# Patient Record
Sex: Female | Born: 1990 | Race: White | Hispanic: No | Marital: Married | Smoking: Never smoker
Health system: Southern US, Community
[De-identification: ages and names within clinical notes are randomized; demographics above are authoritative.]

---
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
## 2019-01-26 ENCOUNTER — Other Ambulatory Visit: Payer: Self-pay | Admitting: Obstetrics and Gynecology

## 2019-02-16 ENCOUNTER — Other Ambulatory Visit: Payer: Self-pay | Admitting: Obstetrics and Gynecology

## 2019-02-16 ENCOUNTER — Telehealth: Payer: Self-pay | Admitting: *Deleted

## 2019-02-16 NOTE — Telephone Encounter (Signed)
Called to inform pt of no visitors or children being allowed to come to appt. Pt states she will have to cancel and will call back to reschedule.

## 2019-05-30 ENCOUNTER — Other Ambulatory Visit: Payer: Self-pay | Admitting: Obstetrics and Gynecology

## 2019-06-16 ENCOUNTER — Telehealth: Payer: Self-pay | Admitting: Obstetrics and Gynecology

## 2019-06-16 NOTE — Telephone Encounter (Signed)

## 2019-06-17 ENCOUNTER — Ambulatory Visit (INDEPENDENT_AMBULATORY_CARE_PROVIDER_SITE_OTHER): Admitting: Obstetrics and Gynecology

## 2019-06-17 ENCOUNTER — Other Ambulatory Visit (HOSPITAL_COMMUNITY)
Admission: RE | Admit: 2019-06-17 | Discharge: 2019-06-17 | Disposition: A | Discharge status: Home / Self Care | Source: Ambulatory Visit | Attending: Obstetrics and Gynecology | Admitting: Obstetrics and Gynecology

## 2019-06-17 ENCOUNTER — Other Ambulatory Visit: Payer: Self-pay

## 2019-06-17 ENCOUNTER — Encounter: Payer: Self-pay | Admitting: Obstetrics and Gynecology

## 2019-06-17 VITALS — BP 99/59 | HR 75 | Ht 67.0 in | Wt 171.0 lb

## 2019-06-17 DIAGNOSIS — Z01419 Encounter for gynecological examination (general) (routine) without abnormal findings: Secondary | ICD-10-CM

## 2019-06-17 NOTE — Progress Notes (Signed)
Patient ID: Rebekah Livingston, female   DOB: [birth year], 28 y.o.   MRN: [id number]   Assessment:  Annual Gyn Exam Fhx of breast cancer Plan:  1. pap smear done, next pap due 3 years 2. return annually or prn 3    Annual mammogram advised after age 25 Subjective:  Rebekah Livingston is a 28 y.o. female No obstetric history on file. who presents for annual exam. Patient's last menstrual period was 05/28/2019. The patient has complaints today of No abnormal PAP. Has some spotting in between periods and acne flares up. Does self breast exams, notices nothing abnormal. Paternal grandmother had breast cancer at age 32. Husband has vasectomy, husband is in the [employer].   The following portions of the patient's history were reviewed and updated as appropriate: allergies, current medications, past family history, past medical history, past social history, past surgical history and problem list. No past medical history on file.    No current outpatient medications on file.  Review of Systems Constitutional: negative Gastrointestinal: negative Genitourinary: normal  Objective:  BP (!) 99/59 (BP Location: Right Arm, Patient Position: Sitting, Cuff Size: Normal)   Pulse 75   Ht 5\' 7"  (1.702 m)   Wt 171 lb (77.6 kg)   LMP 05/28/2019   BMI 26.78 kg/m    BMI: Body mass index is 26.78 kg/m.  General Appearance: Alert, appropriate appearance for age. No acute distress HEENT: Grossly normal Neck / Thyroid:  Cardiovascular: RRR; normal S1, S2, no murmur Lungs: CTA bilaterally Back: No CVAT Breast Exam:Not examined Gastrointestinal: Soft, non-tender, no masses or organomegaly Pelvic Exam: VAGINA: normal appearing vagina CERVIX: normal appearing cervix with normal mucousa,  UTERUS: uterus is normal, retroverted  PAP: Pap smear done today. Lymphatic Exam: Non-palpable nodes in neck, clavicular, axillary, or inguinal regions  Skin: no rash or abnormalities Neurologic: Normal gait and speech, no tremor   Psychiatric: Alert and oriented, appropriate affect.  Urinalysis:Not done  By signing my name below, I, Samul Dada, attest that this documentation has been prepared under the direction and in the presence of Jonnie Kind, MD. Electronically Signed: [place]. 06/17/19. 10:45 AM.  I personally performed the services described in this documentation, which was SCRIBED in my presence. The recorded information has been reviewed and considered accurate. It has been edited as necessary during review. Jonnie Kind, MD

## 2019-06-21 LAB — CYTOLOGY - PAP
Diagnosis: NEGATIVE
HPV: NOT DETECTED

## 2020-05-02 ENCOUNTER — Telehealth: Payer: Self-pay | Admitting: Obstetrics and Gynecology

## 2020-05-02 NOTE — Telephone Encounter (Signed)
Patient called stating that she would like to discuss something with the nurse, Pt did not state the reason. Please contact pt

## 2020-05-02 NOTE — Telephone Encounter (Signed)
Pt reports that she is having irregular periods and just hasn't felt well. She knows that she isn't pregnant. Wanted to see Dr. Emelda Fear. Made her an appointment for next Friday.

## 2020-05-11 ENCOUNTER — Ambulatory Visit: Admitting: Obstetrics and Gynecology

## 2020-05-15 ENCOUNTER — Telehealth: Payer: Self-pay | Admitting: Women's Health

## 2020-05-15 NOTE — Telephone Encounter (Signed)
Called patient regarding appointment and the following message was left:   Updated visitor policy: We are now allowing one support person with you during your upcoming visit.  However, we do ask that they wear a mask and will also be screened at check-in.   We ask if you are sick, have any symptoms of COVID, have had any exposure to anyone suspected or confirmed of having COVID-19, or are awaiting test results for COVID-19, to call our office as we may need to reschedule you for a virtual visit or schedule your appointment for a later date.    Please know we will ask you these questions or similar questions when you arrive for your appointment and understand this is how we are keeping everyone safe.    Also,to keep you safe, please use the provided hand sanitizer when you enter the office. We are asking everyone in the office to wear a mask to help prevent the spread of germs. If you have a mask of your own, please wear it to your appointment, if not, we are happy to provide one for you.  Thank you for understanding and your cooperation.    [facility] Staff      

## 2020-05-16 ENCOUNTER — Other Ambulatory Visit (HOSPITAL_COMMUNITY)
Admission: RE | Admit: 2020-05-16 | Discharge: 2020-05-16 | Disposition: A | Discharge status: Home / Self Care | Payer: PRIVATE HEALTH INSURANCE | Source: Ambulatory Visit | Attending: Obstetrics and Gynecology | Admitting: Obstetrics and Gynecology

## 2020-05-16 ENCOUNTER — Ambulatory Visit: Payer: PRIVATE HEALTH INSURANCE | Admitting: Women's Health

## 2020-05-16 ENCOUNTER — Encounter: Payer: Self-pay | Admitting: Women's Health

## 2020-05-16 VITALS — BP 111/81 | HR 69 | Ht 66.0 in | Wt 182.0 lb

## 2020-05-16 DIAGNOSIS — Z8349 Family history of other endocrine, nutritional and metabolic diseases: Secondary | ICD-10-CM | POA: Diagnosis not present

## 2020-05-16 DIAGNOSIS — R635 Abnormal weight gain: Secondary | ICD-10-CM

## 2020-05-16 DIAGNOSIS — N926 Irregular menstruation, unspecified: Secondary | ICD-10-CM

## 2020-05-16 DIAGNOSIS — Z3202 Encounter for pregnancy test, result negative: Secondary | ICD-10-CM

## 2020-05-16 DIAGNOSIS — R5383 Other fatigue: Secondary | ICD-10-CM

## 2020-05-16 LAB — POCT URINE PREGNANCY: Preg Test, Ur: NEGATIVE

## 2020-05-16 NOTE — Progress Notes (Signed)
   GYN VISIT Patient name: Rebekah Livingston MRN [id number]  Date of birth: 1991-07-08 Chief Complaint:   Menstrual Problem (Thyroid)  History of Present Illness:   Rebekah Livingston is a 29 y.o. [phone] Caucasian female being seen today for report of irregular periods and wanting to get thyroid checked. Periods were regular until about 3 months ago, now can be up to 2 wks late, lasts x4d, changes pad 4x/time, small clots, no cramps. Some occasional odor, no itching/odor/irritation. Mom and MGM have thyroid problems. States she was checked during her last pregnancy and it was 'off'. Has some fatigue. Husband has had vasectomy.  Weight gain over last few months despite becoming more active/walking at work. Was 171lb at last visit w/ [place] July 2020. Depression screen [name] 2/9 06/17/2019  Decreased Interest 0  Down, Depressed, Hopeless 0  PHQ - 2 Score 0    Patient's last menstrual period was 04/30/2020 (exact date). The current method of family planning is vasectomy.  Last pap 06/17/19. Results were:  normal Review of Systems:   Pertinent items are noted in HPI Denies fever/chills, dizziness, headaches, visual disturbances, fatigue, shortness of breath, chest pain, abdominal pain, vomiting, abnormal vaginal discharge/itching/odor/irritation, problems with periods, bowel movements, urination, or intercourse unless otherwise stated above.  Pertinent History Reviewed:  Reviewed past medical,surgical, social, obstetrical and family history.  Reviewed problem list, medications and allergies. Physical Assessment:   Vitals:   05/16/20 0904  BP: 111/81  Pulse: 69  Weight: 182 lb (82.6 kg)  Height: 5\' 6"  (1.676 m)  Body mass index is 29.38 kg/m.       Physical Examination:   General appearance: alert, well appearing, and in no distress  Mental status: alert, oriented to person, place, and time  Skin: warm & dry   Cardiovascular: normal heart rate noted  Respiratory: normal respiratory effort, no  distress  Abdomen: soft, non-tender   Pelvic: VULVA: normal appearing vulva with no masses, tenderness or lesions, VAGINA: normal appearing vagina with normal color and discharge, no lesions, CERVIX: normal appearing cervix without discharge or lesions  Extremities: no edema   Chaperone:    Results for orders placed or performed in visit on 05/16/20 (from the past 24 hour(s))  POCT urine pregnancy   Collection Time: 05/16/20  9:07 AM  Result Value Ref Range   Preg Test, Ur Negative Negative    Assessment & Plan:  1) Irregular periods> CV swab sent, check TSH  2) Fatigue, weight gain, family h/o thyroid problems> check TSH, free T4  Meds: No orders of the defined types were placed in this encounter.   Orders Placed This Encounter  Procedures  . TSH  . T4, free  . POCT urine pregnancy    Return in about 1 year (around 05/16/2021) for Physical.  05/18/2021 CNM, [facility] 05/16/2020 9:26 AM

## 2020-05-17 LAB — CERVICOVAGINAL ANCILLARY ONLY
Bacterial Vaginitis (gardnerella): NEGATIVE
Candida Glabrata: NEGATIVE
Candida Vaginitis: NEGATIVE
Chlamydia: NEGATIVE
Comment: NEGATIVE
Comment: NEGATIVE
Comment: NEGATIVE
Comment: NEGATIVE
Comment: NEGATIVE
Comment: NORMAL
Neisseria Gonorrhea: NEGATIVE
Trichomonas: NEGATIVE

## 2020-05-17 LAB — T4, FREE: Free T4: 1.23 ng/dL (ref 0.82–1.77)

## 2020-05-17 LAB — TSH: TSH: 1.11 u[IU]/mL (ref 0.450–4.500)

## 2020-10-10 ENCOUNTER — Other Ambulatory Visit: Payer: Self-pay

## 2020-10-10 ENCOUNTER — Ambulatory Visit
Admission: RE | Admit: 2020-10-10 | Discharge: 2020-10-10 | Disposition: A | Discharge status: Home / Self Care | Payer: PRIVATE HEALTH INSURANCE | Source: Ambulatory Visit | Attending: Emergency Medicine | Admitting: Emergency Medicine

## 2020-10-10 VITALS — BP 118/74 | HR 97 | Temp 98.4°F | Resp 17 | Ht 66.0 in | Wt 170.0 lb

## 2020-10-10 DIAGNOSIS — R509 Fever, unspecified: Secondary | ICD-10-CM

## 2020-10-10 DIAGNOSIS — R059 Cough, unspecified: Secondary | ICD-10-CM

## 2020-10-10 DIAGNOSIS — Z20828 Contact with and (suspected) exposure to other viral communicable diseases: Secondary | ICD-10-CM

## 2020-10-10 MED ORDER — BENZONATATE 100 MG PO CAPS: 100.0000 mg | ORAL_CAPSULE | Freq: Three times a day (TID) | ORAL | 0 refills | Status: AC

## 2020-10-10 NOTE — ED Triage Notes (Signed)
Headache, fever, throat irritation and cough in the morning x 2 days. Children tested + for RSV.

## 2020-10-10 NOTE — Discharge Instructions (Signed)

## 2020-10-10 NOTE — ED Provider Notes (Signed)
[facility]   [id number] 10/10/20 Arrival Time: 1522   CC: COVID symptoms  SUBJECTIVE: History from: patient.  Rebekah Livingston is a 29 y.o. female who presents with headache, fever, throat irritation, and cough x 2 days.  Children tested positive for RSV.   Has tried OTC medications without relief.  Symptoms are made worse in the morning.  Reports previous covid infection in the past.   Denies SOB, wheezing, chest pain, nausea, changes in bowel or bladder habits.    ROS: As per HPI.  All other pertinent ROS negative.     History reviewed. No pertinent past medical history. Past Surgical History:  Procedure Laterality Date  . BLADDER SURGERY  1998   Allergies  Allergen Reactions  . Amoxicillin Hives   No current facility-administered medications on file prior to encounter.   No current outpatient medications on file prior to encounter.   Social History   Socioeconomic History  . Marital status: Married    Spouse name: Not on file  . Number of children: Not on file  . Years of education: Not on file  . Highest education level: Not on file  Occupational History  . Not on file  Tobacco Use  . Smoking status: Never Smoker  . Smokeless tobacco: Never Used  Vaping Use  . Vaping Use: Never used  Substance and Sexual Activity  . Alcohol use: Yes    Comment: rarely  . Drug use: Never  . Sexual activity: Yes    Birth control/protection: Surgical    Comment: husband has vasectomy  Other Topics Concern  . Not on file  Social History Narrative  . Not on file   Social Determinants of Health   Financial Resource Strain:   . Difficulty of Paying Living Expenses: Not on file  Food Insecurity:   . Worried About Programme researcher, broadcasting/film/video in the Last Year: Not on file  . Ran Out of Food in the Last Year: Not on file  Transportation Needs:   . Lack of Transportation (Medical): Not on file  . Lack of Transportation (Non-Medical): Not on file  Physical Activity:   . Days  of Exercise per Week: Not on file  . Minutes of Exercise per Session: Not on file  Stress:   . Feeling of Stress : Not on file  Social Connections:   . Frequency of Communication with Friends and Family: Not on file  . Frequency of Social Gatherings with Friends and Family: Not on file  . Attends Religious Services: Not on file  . Active Member of Clubs or Organizations: Not on file  . Attends Banker Meetings: Not on file  . Marital Status: Not on file  Intimate Partner Violence:   . Fear of Current or Ex-Partner: Not on file  . Emotionally Abused: Not on file  . Physically Abused: Not on file  . Sexually Abused: Not on file   Family History  Problem Relation Age of Onset  . Diabetes Paternal Grandfather   . Diabetes Paternal Grandmother   . ALS Maternal Grandmother   . Scleroderma Mother     OBJECTIVE:  Vitals:   10/10/20 1533  BP: 118/74  Pulse: 97  Resp: 17  Temp: 98.4 F (36.9 C)  TempSrc: Oral  SpO2: 99%  Weight: 170 lb (77.1 kg)  Height: 5\' 6"  (1.676 m)     General appearance: alert; appears mildly fatigued, but nontoxic; speaking in full sentences and tolerating own secretions HEENT: NCAT; Ears: EACs  clear, TMs pearly gray; Eyes: PERRL.  EOM grossly intact. Nose: nares patent without rhinorrhea, Throat: oropharynx clear, tonsils non erythematous or enlarged, uvula midline  Neck: supple without LAD Lungs: unlabored respirations, symmetrical air entry; cough: absent; no respiratory distress; CTAB Heart: regular rate and rhythm.   Skin: warm and dry Psychological: alert and cooperative; normal mood and affect   ASSESSMENT & PLAN:  1. Fever, unspecified   2. Cough   3. RSV exposure     Meds ordered this encounter  Medications  . benzonatate (TESSALON) 100 MG capsule    Sig: Take 1 capsule (100 mg total) by mouth every 8 (eight) hours.    Dispense:  21 capsule    Refill:  0    Order Specific Question:   Supervising Provider    Answer:    Eustace Moore [[id number]]    COVID testing ordered.  It will take between 5-7 days for test results.  Someone will contact you regarding abnormal results.    In the meantime: You should remain isolated in your home for 10 days from symptom onset AND greater than 72 hours after symptoms resolution (absence of fever without the use of fever-reducing medication and improvement in respiratory symptoms), whichever is longer Get plenty of rest and push fluids Tessalon Perles prescribed for cough Use OTC zyrtec for nasal congestion, runny nose, and/or sore throat Use OTC flonase for nasal congestion and runny nose Use medications daily for symptom relief Use OTC medications like ibuprofen or tylenol as needed fever or pain Call or go to the ED if you have any new or worsening symptoms such as fever, worsening cough, shortness of breath, chest tightness, chest pain, turning blue, changes in mental status, etc...   Reviewed expectations re: course of current medical issues. Questions answered. Outlined signs and symptoms indicating need for more acute intervention. Patient verbalized understanding. After Visit Summary given.         Rennis Harding, PA-C 10/10/20 1551

## 2020-10-12 LAB — COVID-19, FLU A+B AND RSV
Influenza A, NAA: NOT DETECTED
Influenza B, NAA: NOT DETECTED
RSV, NAA: NOT DETECTED
SARS-CoV-2, NAA: NOT DETECTED

## 2020-10-14 ENCOUNTER — Telehealth: Payer: Self-pay | Admitting: Emergency Medicine

## 2020-10-14 MED ORDER — PREDNISONE 10 MG PO TABS: 20.0000 mg | ORAL_TABLET | Freq: Every day | ORAL | 0 refills | Status: AC

## 2021-12-19 ENCOUNTER — Ambulatory Visit: Payer: No Typology Code available for payment source | Admitting: Adult Health

## 2022-03-22 ENCOUNTER — Ambulatory Visit
Admission: EM | Admit: 2022-03-22 | Discharge: 2022-03-22 | Disposition: A | Discharge status: Home / Self Care | Payer: No Typology Code available for payment source | Attending: Nurse Practitioner | Admitting: Nurse Practitioner

## 2022-03-22 ENCOUNTER — Ambulatory Visit (INDEPENDENT_AMBULATORY_CARE_PROVIDER_SITE_OTHER): Payer: No Typology Code available for payment source

## 2022-03-22 DIAGNOSIS — M25531 Pain in right wrist: Secondary | ICD-10-CM

## 2022-03-22 DIAGNOSIS — S63501A Unspecified sprain of right wrist, initial encounter: Secondary | ICD-10-CM

## 2022-03-22 MED ORDER — IBUPROFEN 800 MG PO TABS: 800.0000 mg | ORAL_TABLET | Freq: Three times a day (TID) | ORAL | 0 refills | Status: AC | PRN

## 2022-03-22 NOTE — ED Provider Notes (Signed)
?[facility] ? ? ? ?CSN: [id number] ?Arrival date & time: 03/22/22  0846 ? ? ?  ? ?History   ?Chief Complaint ?Chief Complaint  ?Patient presents with  ? Wrist Pain  ? ? ?HPI ?Rebekah Livingston is a 31 y.o. female.  ? ?The patient is a 31 year old female who presents with right wrist pain.  Symptoms started 1 day ago after she went bowling.  States that when she went to release the ball, she felt a pop in the wrist.  She immediately swelling afterwards.  She presents today with pain in the ulnar aspect of her wrist, she also reports pain with rotation of the wrist.  She states that when she went home, wrap the wrist, took ibuprofen.  Denies any previous injury, numbness, tingling, or radiation of pain.  She has no pain in her hand or fingers. ? ?The history is provided by the patient.  ? ?History reviewed. No pertinent past medical history. ? ?There are no problems to display for this patient. ? ? ?Past Surgical History:  ?Procedure Laterality Date  ? BLADDER SURGERY  1998  ? ? ?OB History   ? ? Gravida  ?3  ? Para  ?2  ? Term  ?2  ? Preterm  ?   ? AB  ?1  ? Living  ?2  ?  ? ? SAB  ?1  ? IAB  ?   ? Ectopic  ?   ? Multiple  ?   ? Live Births  ?   ?   ?  ?  ? ? ? ?Home Medications   ? ?Prior to Admission medications   ?Medication Sig Start Date End Date Taking? Authorizing Provider  ?benzonatate (TESSALON) 100 MG capsule Take 1 capsule (100 mg total) by mouth every 8 (eight) hours. 10/10/20   Wurst, [place], PA-C  ?predniSONE (DELTASONE) 10 MG tablet Take 2 tablets (20 mg total) by mouth daily. 10/14/20   Durward Parcel, FNP  ? ? ?Family History ?Family History  ?Problem Relation Age of Onset  ? Diabetes Paternal Grandfather   ? Diabetes Paternal Grandmother   ? ALS Maternal Grandmother   ? Scleroderma Mother   ? ? ?Social History ?Social History  ? ?Tobacco Use  ? Smoking status: Never  ? Smokeless tobacco: Never  ?Vaping Use  ? Vaping Use: Never used  ?Substance Use Topics  ? Alcohol use: Yes  ?  Comment:  rarely  ? Drug use: Never  ? ? ? ?Allergies   ?Amoxicillin ? ? ?Review of Systems ?Review of Systems  ?Musculoskeletal:   ?     Right wrist pain and swelling  ?Skin: Negative.   ?Psychiatric/Behavioral: Negative.    ? ? ?Physical Exam ?Triage Vital Signs ?ED Triage Vitals  ?Enc Vitals Group  ?   BP 03/22/22 0858 123/79  ?   Pulse Rate 03/22/22 0858 66  ?   Resp 03/22/22 0858 16  ?   Temp 03/22/22 0858 97.9 ?F (36.6 ?C)  ?   Temp Source 03/22/22 0858 Oral  ?   SpO2 03/22/22 0858 99 %  ?   Weight --   ?   Height --   ?   Head Circumference --   ?   Peak Flow --   ?   Pain Score 03/22/22 0855 4  ?   Pain Loc --   ?   Pain Edu? --   ?   Excl. in GC? --   ? ?No data  found. ? ?Updated Vital Signs ?BP 123/79 (BP Location: Right Arm)   Pulse 66   Temp 97.9 ?F (36.6 ?C) (Oral)   Resp 16   LMP  (Within Weeks) Comment: 3 weeks  SpO2 99%  ? ?Visual Acuity ?Right Eye Distance:   ?Left Eye Distance:   ?Bilateral Distance:   ? ?Right Eye Near:   ?Left Eye Near:    ?Bilateral Near:    ? ?Physical Exam ?Vitals reviewed.  ?Constitutional:   ?   Appearance: Normal appearance.  ?Musculoskeletal:  ?   Right wrist: Tenderness (ulnar and volar aspect of right wrist) present. No swelling, deformity, snuff box tenderness or crepitus. Decreased range of motion. Normal pulse.  ?Skin: ?   General: Skin is warm and dry.  ?Neurological:  ?   Mental Status: She is alert.  ? ? ? ?UC Treatments / Results  ?Labs ?(all labs ordered are listed, but only abnormal results are displayed) ?Labs Reviewed - No data to display ? ?EKG ? ? ?Radiology ?No results found. ? ?Procedures ?Procedures (including critical care time) ? ?Medications Ordered in UC ?Medications - No data to display ? ?Initial Impression / Assessment and Plan / UC Course  ?I have reviewed the triage vital signs and the nursing notes. ? ?Pertinent labs & imaging results that were available during my care of the patient were reviewed by me and considered in my medical decision making  (see chart for details). ? ?31 year old female who presents with right wrist pain.  Symptoms started after she was bowling 1 day ago and felt a pop in the right wrist if she is released in the bowling ball.  He experienced immediate swelling and pain.  She now presents with pain in the volar and ulnar aspect of the right wrist.  She has pain with rotation of the wrist.  She does not present with swelling, ecchymosis, at this time.  There is no radiation of pain, numbness or tingling in the hand.  On exam, she does complain of pain with inversion and pain with flexion.  Her x-rays are negative for fracture or dislocation.Symptoms are consistent with a right wrist sprain.  We will provide the patient with a wrist brace to help forward with support and compression.  RICE therapy until symptoms improve.  We will also prescribe ibuprofen to help with her pain and inflammation.  Patient advised that symptoms may take 2 to 4 weeks for complete resolution.  Patient advised to follow-up with orthopedics if her symptoms worsen or do not improve. Information provided for [facility] and [facility]. ?Final Clinical Impressions(s) / UC Diagnoses  ? ?Final diagnoses:  ?None  ? ?Discharge Instructions   ?None ?  ? ?ED Prescriptions   ?None ?  ? ?PDMP not reviewed this encounter. ?  ?Abran Cantor, NP ?03/22/22 1023 ? ?

## 2022-03-22 NOTE — ED Triage Notes (Incomplete)
Pt reports right wrist pain x 1 day. States pain is worse when se moves the wrist. Pt can not recall any injury.  ?

## 2022-03-22 NOTE — Discharge Instructions (Addendum)
Your x-rays are negative for fracture or dislocation today. ?Use the wrist brace to help with support and compression.  Wear this if you are performing any strenuous activity.   ?Avoid heavy lifting until symptoms improve. ?RICE therapy, rest, ice, compression, and elevation. Apply ice 3-4 times daily, on for 20 minutes, remove for 1 hour, then repeat. ?Follow-up with your PCP or orthopedics if symptoms do not improve within the next 2 to 4 weeks. You can follow up with [facility] at [phone] or [facility] at [phone]. ?

## 2022-06-16 ENCOUNTER — Ambulatory Visit: Payer: Self-pay | Admitting: Adult Health

## 2022-09-24 IMAGING — DX DG WRIST COMPLETE 3+V*R*
3 series · 3 of 3 positions shown · non-contrast
Comparison: None.

CLINICAL DATA: Ulnar-sided wrist pain after bowling

EXAM:
RIGHT WRIST - COMPLETE 3+ VIEW

[wrist pa]
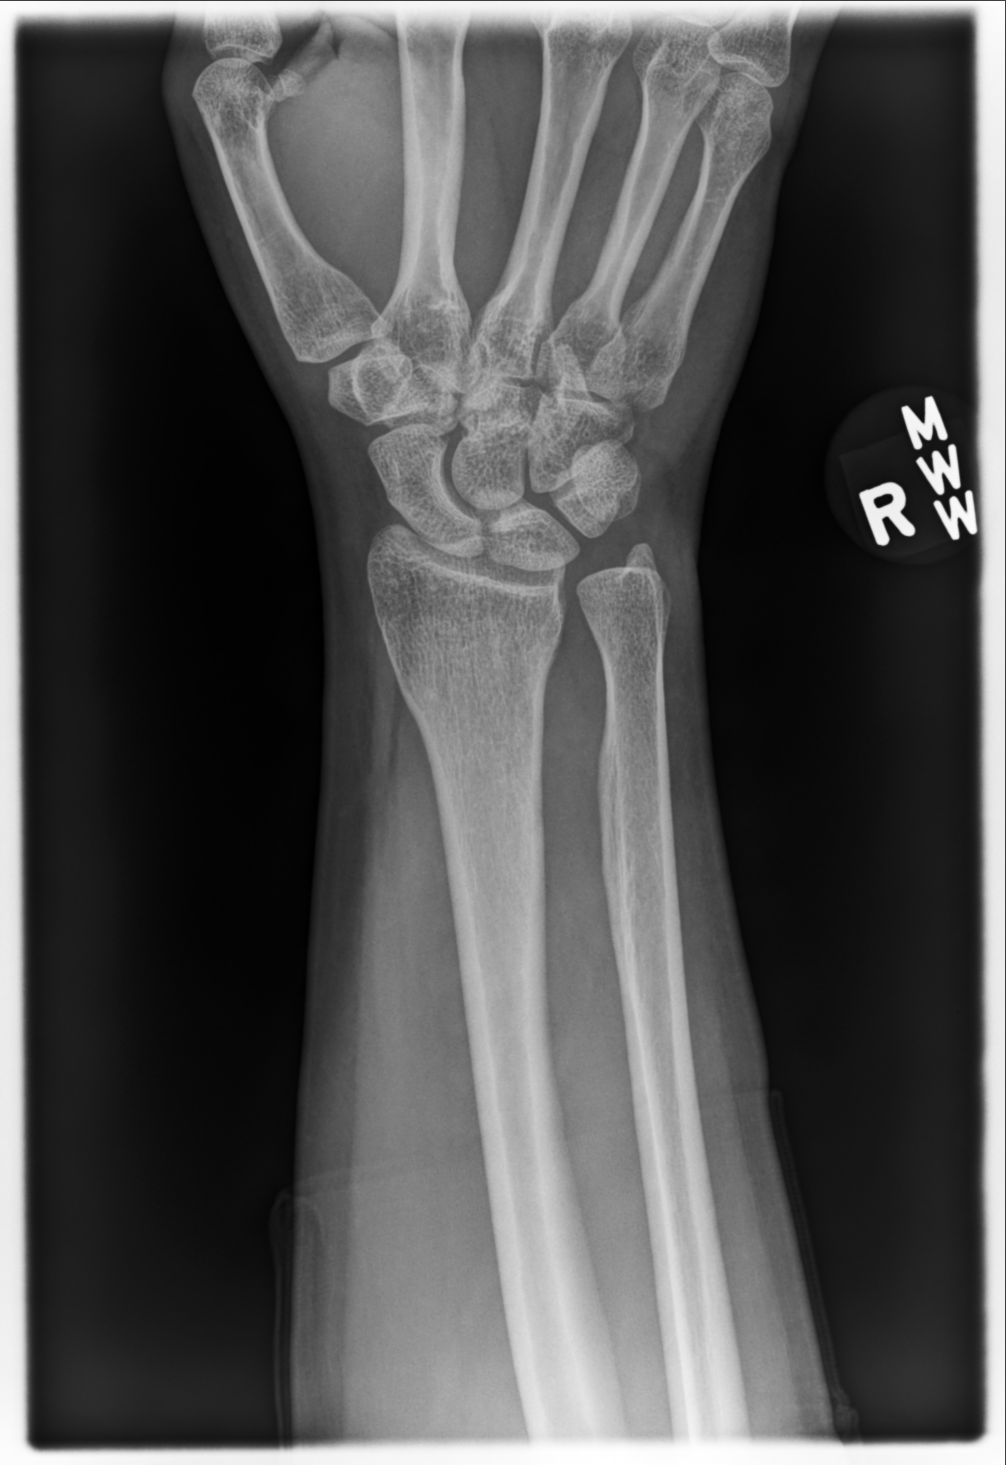

[wrist mlo]
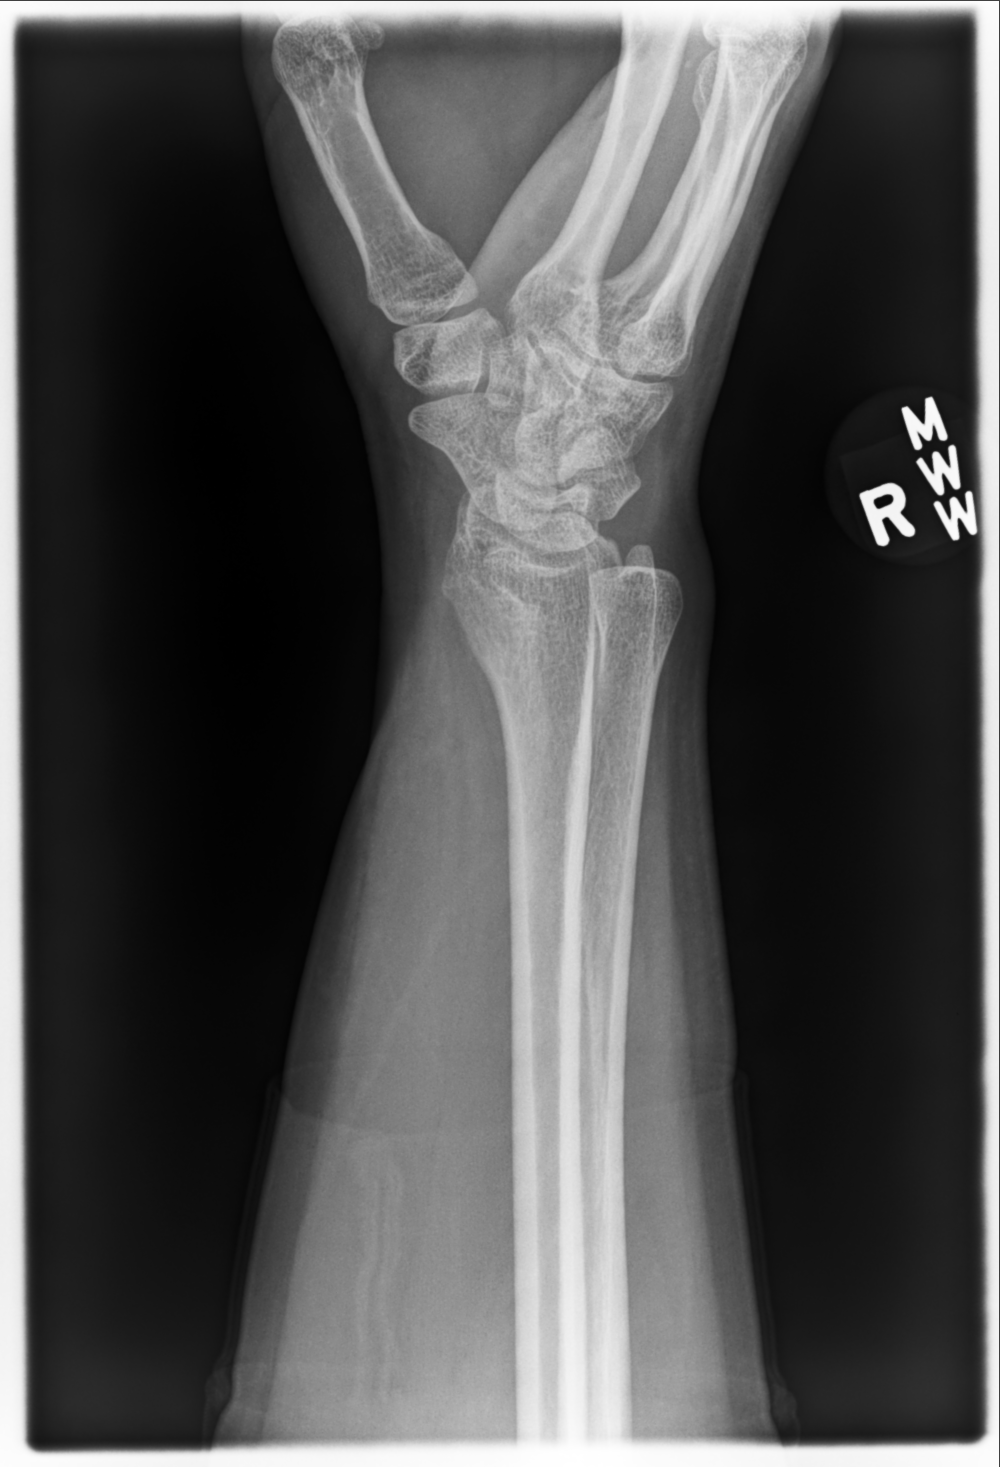

[wrist lat]
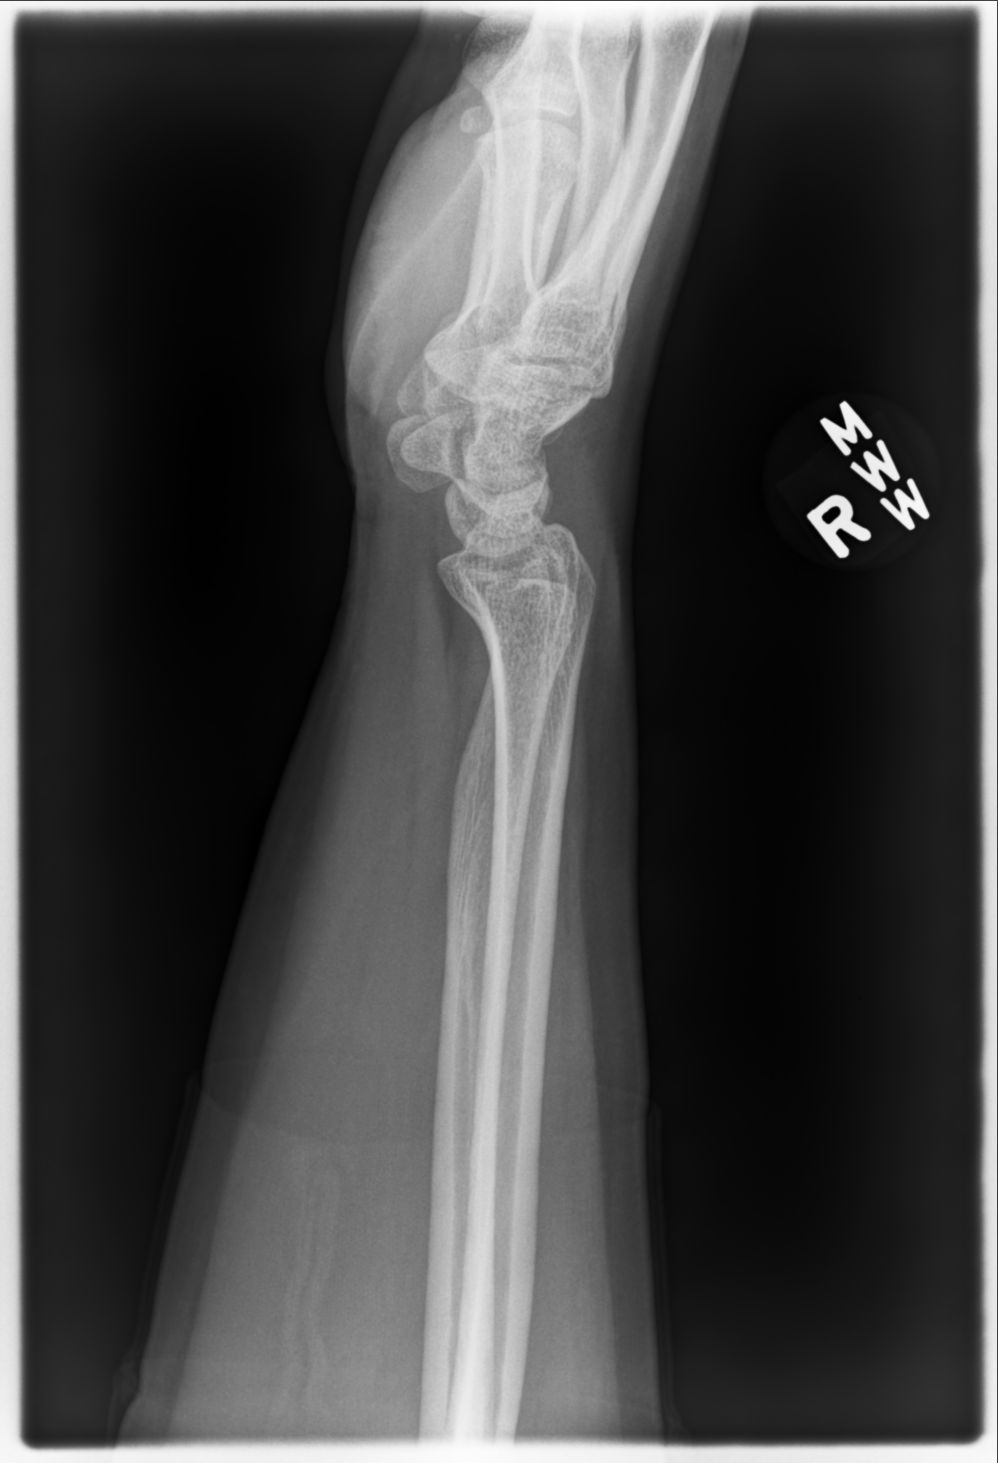

[3 of 3 positions shown; findings below may reference images not displayed]

FINDINGS: There is no evidence of fracture or dislocation. There is no
evidence of arthropathy or other focal bone abnormality. Soft
tissues are unremarkable.
IMPRESSION: Negative.
